# Patient Record
Sex: Female | Born: 1960 | Race: White | Hispanic: No | Marital: Single | Smoking: Current every day smoker
Health system: Southern US, Community
[De-identification: ages and names within clinical notes are randomized; demographics above are authoritative.]

## PROBLEM LIST (undated history)

## (undated) DIAGNOSIS — C801 Malignant (primary) neoplasm, unspecified: Secondary | ICD-10-CM

## (undated) HISTORY — PX: ABDOMINAL HYSTERECTOMY: SHX81

---

## 2012-04-15 ENCOUNTER — Ambulatory Visit: Payer: Self-pay | Admitting: Family Medicine

## 2012-06-19 ENCOUNTER — Emergency Department: Payer: Self-pay | Admitting: Emergency Medicine

## 2012-06-19 LAB — COMPREHENSIVE METABOLIC PANEL
Albumin: 3.4 g/dL (ref 3.4–5.0)
Alkaline Phosphatase: 71 U/L (ref 50–136)
Anion Gap: 3 — ABNORMAL LOW (ref 7–16)
BUN: 9 mg/dL (ref 7–18)
Calcium, Total: 8.2 mg/dL — ABNORMAL LOW (ref 8.5–10.1)
Chloride: 105 mmol/L (ref 98–107)
Creatinine: 0.79 mg/dL (ref 0.60–1.30)
EGFR (African American): >60
Osmolality: 276 (ref 275–301)
SGPT (ALT): 20 U/L (ref 12–78)
Sodium: 139 mmol/L (ref 136–145)
Total Protein: 7.3 g/dL (ref 6.4–8.2)

## 2012-06-19 LAB — URINALYSIS, COMPLETE
Bacteria: NONE SEEN
Bilirubin,UR: NEGATIVE
Glucose,UR: NEGATIVE mg/dL (ref 0–75)
Nitrite: NEGATIVE
Ph: 6 (ref 4.5–8.0)
Protein: NEGATIVE
RBC,UR: 1 /HPF (ref 0–5)

## 2012-06-19 LAB — CBC
HCT: 25.8 % — ABNORMAL LOW (ref 35.0–47.0)
HGB: 8.8 g/dL — ABNORMAL LOW (ref 12.0–16.0)
MCH: 28.7 pg (ref 26.0–34.0)
MCHC: 34 g/dL (ref 32.0–36.0)
RBC: 3.06 10*6/uL — ABNORMAL LOW (ref 3.80–5.20)
RDW: 15.2 % — ABNORMAL HIGH (ref 11.5–14.5)
WBC: 8.5 10*3/uL (ref 3.6–11.0)

## 2012-06-19 LAB — LIPASE, BLOOD: Lipase: 255 U/L (ref 73–393)

## 2014-11-01 ENCOUNTER — Ambulatory Visit: Payer: Self-pay | Admitting: Family Medicine

## 2015-02-15 IMAGING — MG MMM DGT SCR NO ORDER W/CAD
1 series · 4 of 4 positions shown · non-contrast
Comparison: none

REASON FOR EXAM: SCR MAMMO NO ORDER
COMMENTS:

[R CC · right · 4 of 4 slices shown]
[im 1/4]
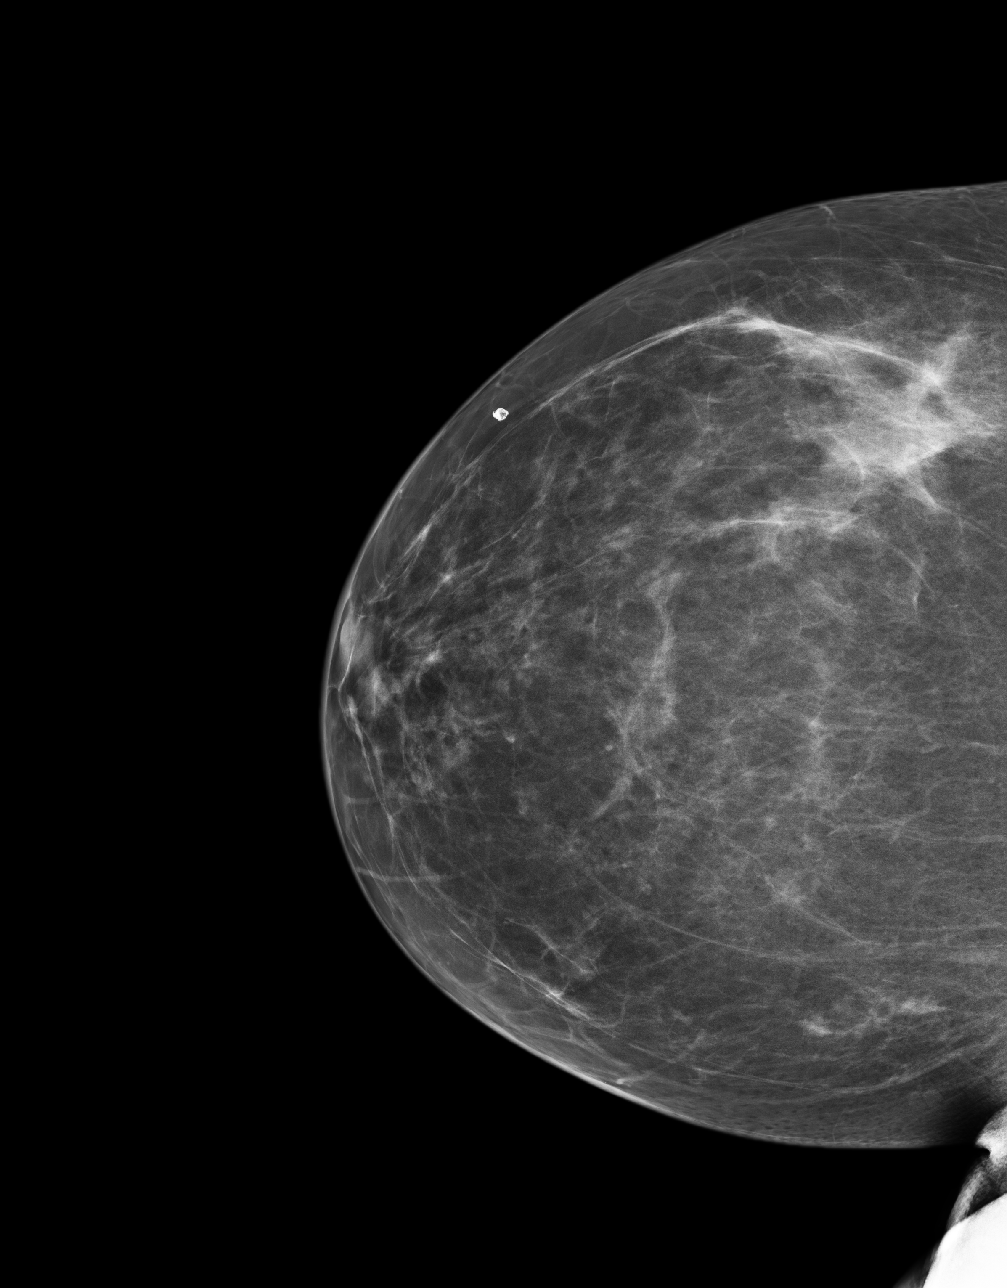
[im 2/4]
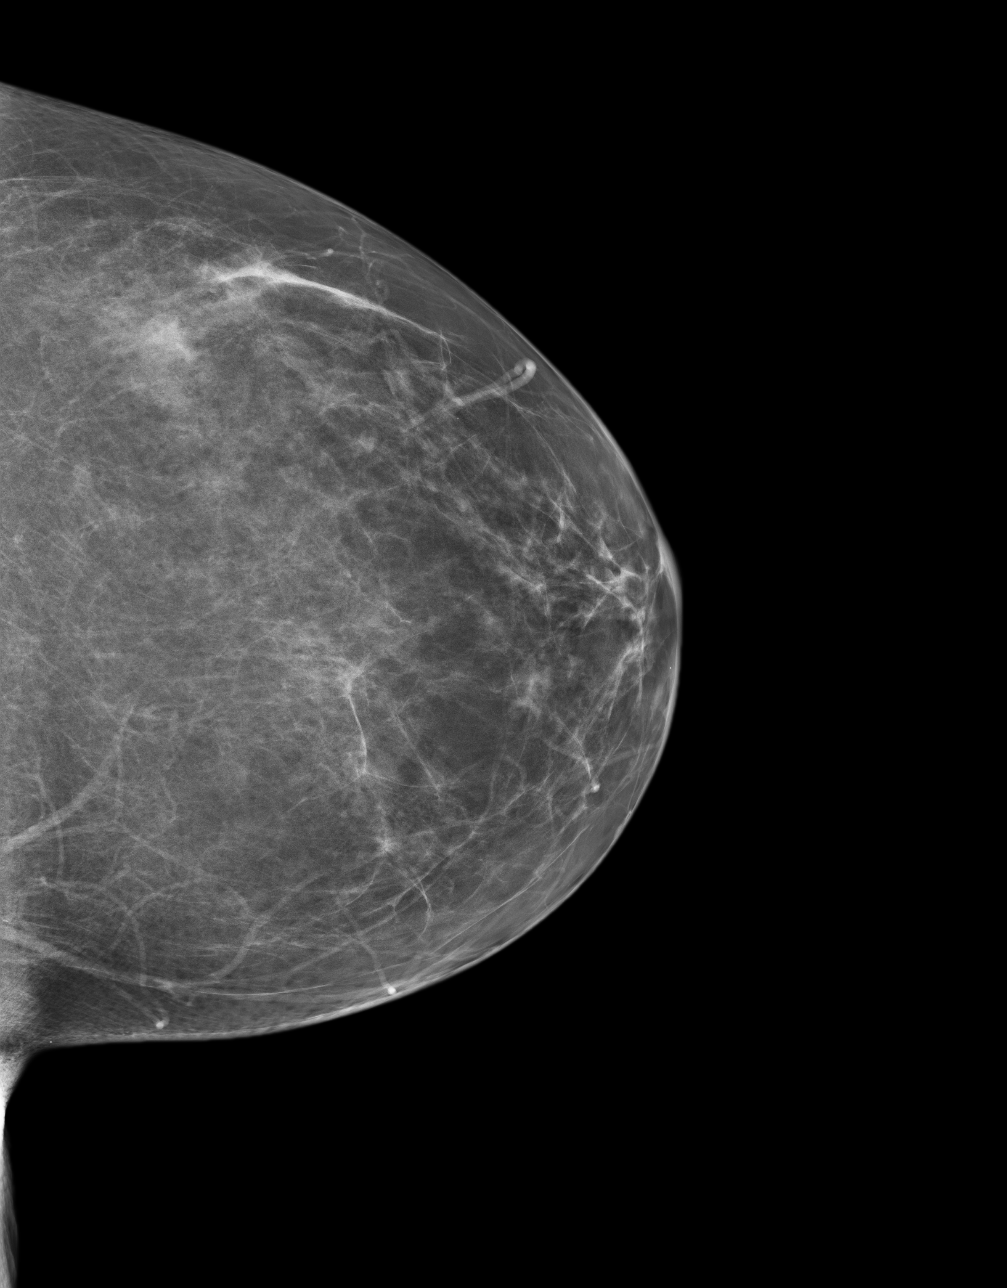
[im 3/4]
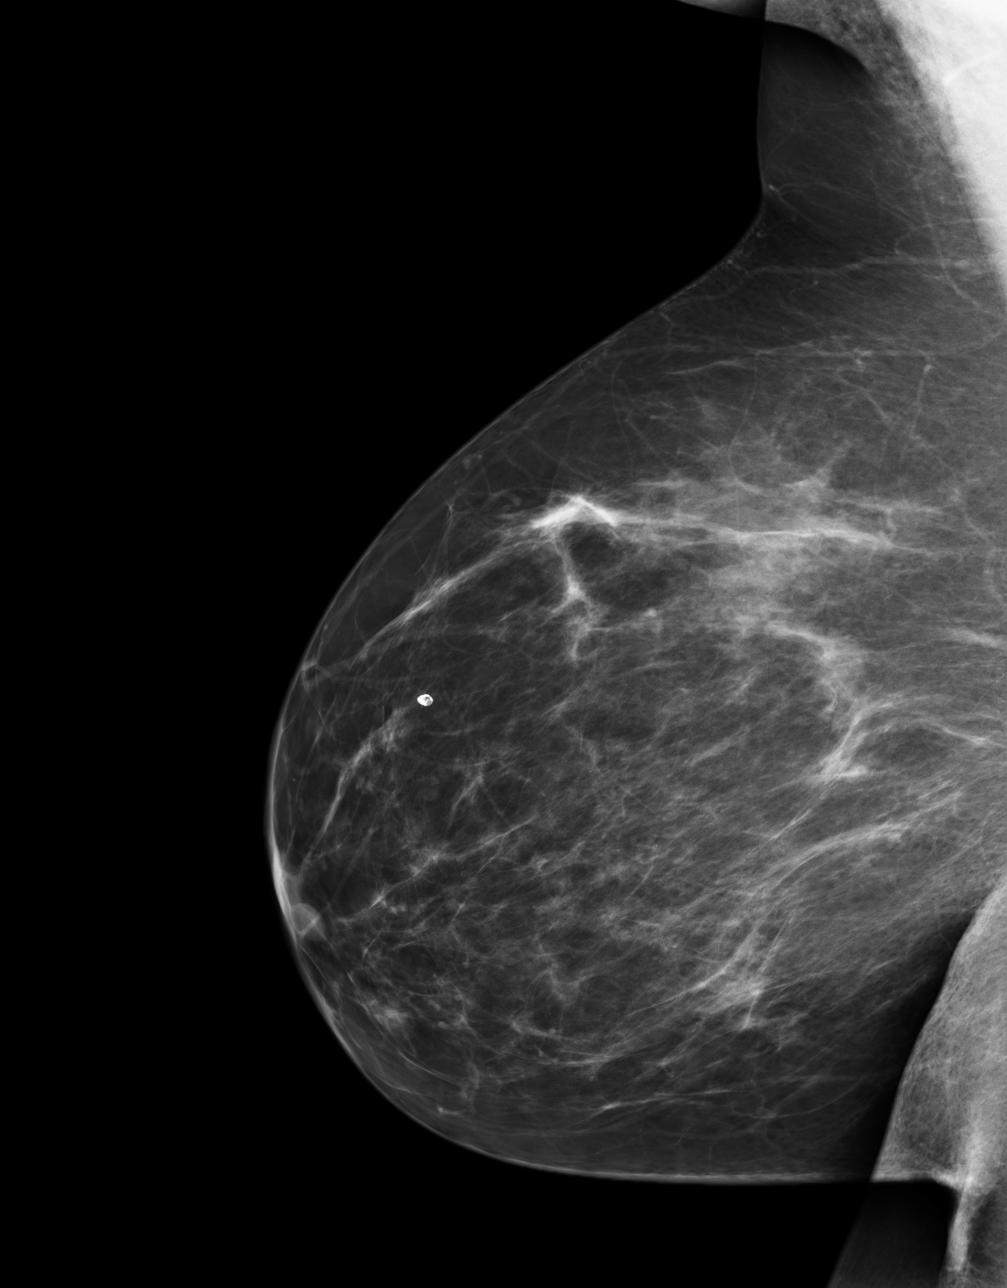
[im 4/4]
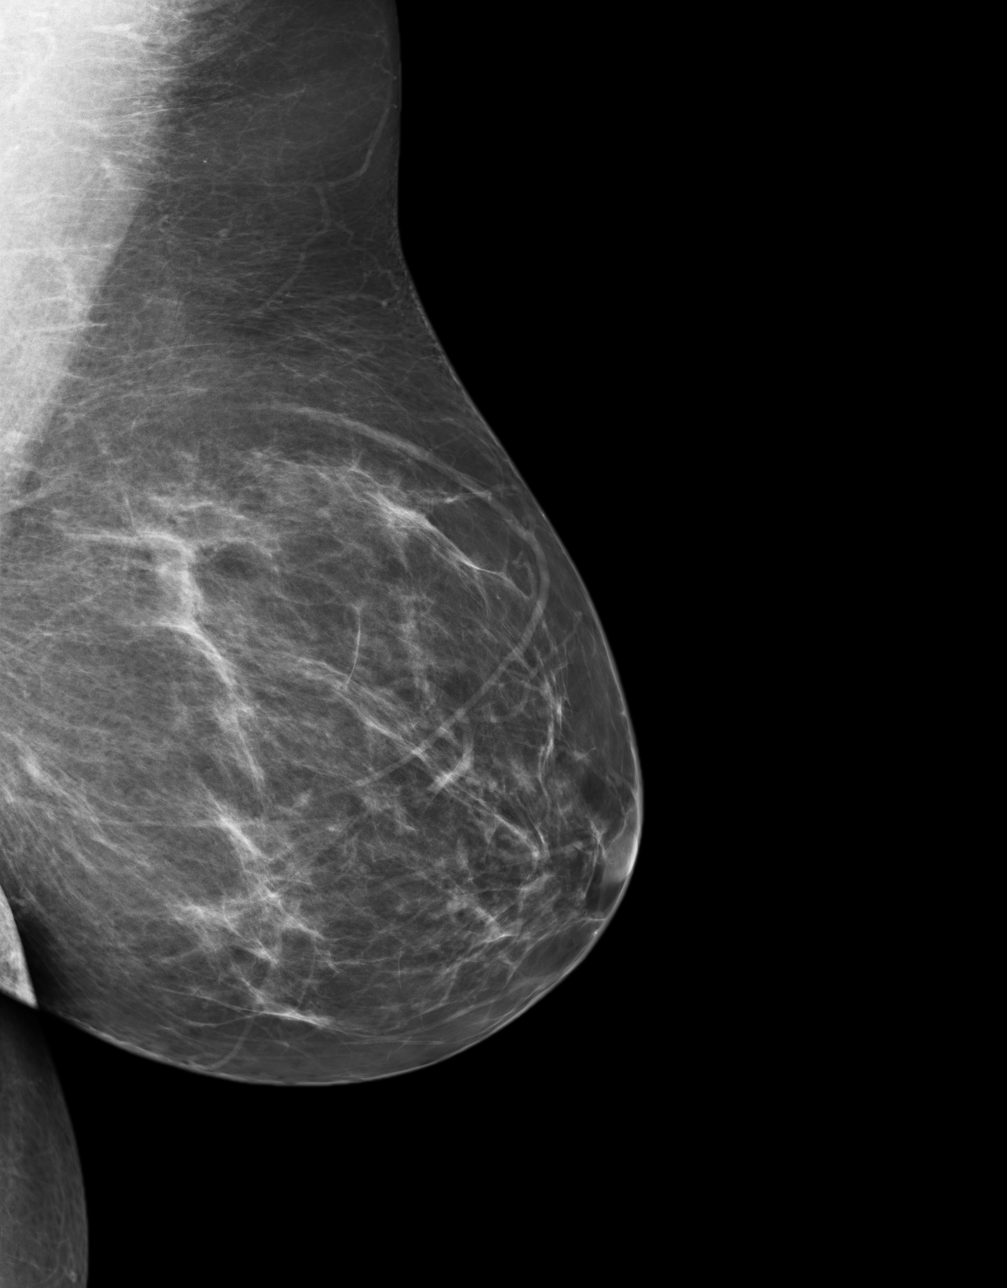

[4 of 4 positions shown; findings below may reference images not displayed]

PROCEDURE:     MMM - MMM DGT SCR NO ORDER W/CAD  - April 15, 2012 [DATE]

RESULT:     This is interpreted as a baseline study. The patient's previous
studies from from an outside facility approximately 12 years ago are not
available for comparison.

The breasts exhibit a scattered fibroglandular pattern. There is no dominant
mass. A benign-appearing macrocalcification is noted in the upper outer
aspect of the right breast. There are no malignant appearing groupings of
microcalcification.
IMPRESSION: There are no findings suspicious for malignancy.

BI-RADS 2: Benign findings.

Recommendation: please began yearly mammographic followup.

BREAST COMPOSITION: The breast composition is SCATTERED FIBROGLANDULAR
TISSUE (glandular tissue is 25-50%)

A NEGATIVE MAMMOGRAM REPORT DOES NOT PRECLUDE BIOPSY OR OTHER EVALUATION OF
A CLINICALLY PALPABLE OR OTHERWISE SUSPICIOUS MASS OR LESION. BREAST CANCER
MAY NOT BE DETECTED BY MAMMOGRAPHY IN UP TO 10% OF CASES.

Dictation site:1

## 2015-06-07 ENCOUNTER — Other Ambulatory Visit: Payer: Self-pay | Admitting: Nurse Practitioner

## 2015-06-07 DIAGNOSIS — Z1231 Encounter for screening mammogram for malignant neoplasm of breast: Secondary | ICD-10-CM

## 2015-06-14 ENCOUNTER — Ambulatory Visit
Admission: RE | Admit: 2015-06-14 | Discharge: 2015-06-14 | Disposition: A | Discharge status: Home / Self Care | Payer: BLUE CROSS/BLUE SHIELD | Source: Ambulatory Visit | Attending: Nurse Practitioner | Admitting: Nurse Practitioner

## 2015-06-14 DIAGNOSIS — Z1231 Encounter for screening mammogram for malignant neoplasm of breast: Secondary | ICD-10-CM | POA: Insufficient documentation

## 2015-06-14 HISTORY — DX: Malignant (primary) neoplasm, unspecified: C80.1

## 2015-06-19 ENCOUNTER — Other Ambulatory Visit: Payer: Self-pay | Admitting: Nurse Practitioner

## 2015-06-19 DIAGNOSIS — R928 Other abnormal and inconclusive findings on diagnostic imaging of breast: Secondary | ICD-10-CM

## 2015-06-27 ENCOUNTER — Other Ambulatory Visit: Payer: Self-pay | Admitting: Nurse Practitioner

## 2015-06-27 ENCOUNTER — Ambulatory Visit
Admission: RE | Admit: 2015-06-27 | Discharge: 2015-06-27 | Disposition: A | Discharge status: Home / Self Care | Payer: BLUE CROSS/BLUE SHIELD | Source: Ambulatory Visit | Attending: Nurse Practitioner | Admitting: Nurse Practitioner

## 2015-06-27 DIAGNOSIS — R921 Mammographic calcification found on diagnostic imaging of breast: Secondary | ICD-10-CM | POA: Diagnosis not present

## 2015-06-27 DIAGNOSIS — R928 Other abnormal and inconclusive findings on diagnostic imaging of breast: Secondary | ICD-10-CM

## 2015-07-13 ENCOUNTER — Ambulatory Visit: Payer: BLUE CROSS/BLUE SHIELD

## 2015-07-13 ENCOUNTER — Ambulatory Visit
Admission: RE | Admit: 2015-07-13 | Discharge: 2015-07-13 | Disposition: A | Discharge status: Home / Self Care | Payer: BLUE CROSS/BLUE SHIELD | Source: Ambulatory Visit | Attending: Nurse Practitioner | Admitting: Nurse Practitioner

## 2015-07-13 DIAGNOSIS — R921 Mammographic calcification found on diagnostic imaging of breast: Secondary | ICD-10-CM

## 2015-07-13 DIAGNOSIS — R920 Mammographic microcalcification found on diagnostic imaging of breast: Secondary | ICD-10-CM | POA: Diagnosis not present

## 2015-07-13 HISTORY — PX: BREAST BIOPSY: SHX20

## 2015-07-17 LAB — SURGICAL PATHOLOGY

## 2016-03-15 ENCOUNTER — Other Ambulatory Visit: Payer: Self-pay | Admitting: Physician Assistant

## 2016-03-15 DIAGNOSIS — M25512 Pain in left shoulder: Secondary | ICD-10-CM

## 2018-04-15 IMAGING — MG MM DIGITAL SCREENING BILATERAL
3 series · 7 of 7 positions shown · non-contrast
Comparison: Previous exam(s).

CLINICAL DATA: Screening.

EXAM:
DIGITAL SCREENING BILATERAL MAMMOGRAM WITH CAD

[R CC · right · 5 of 5 slices shown (1 of 2)]
[im 1/5]
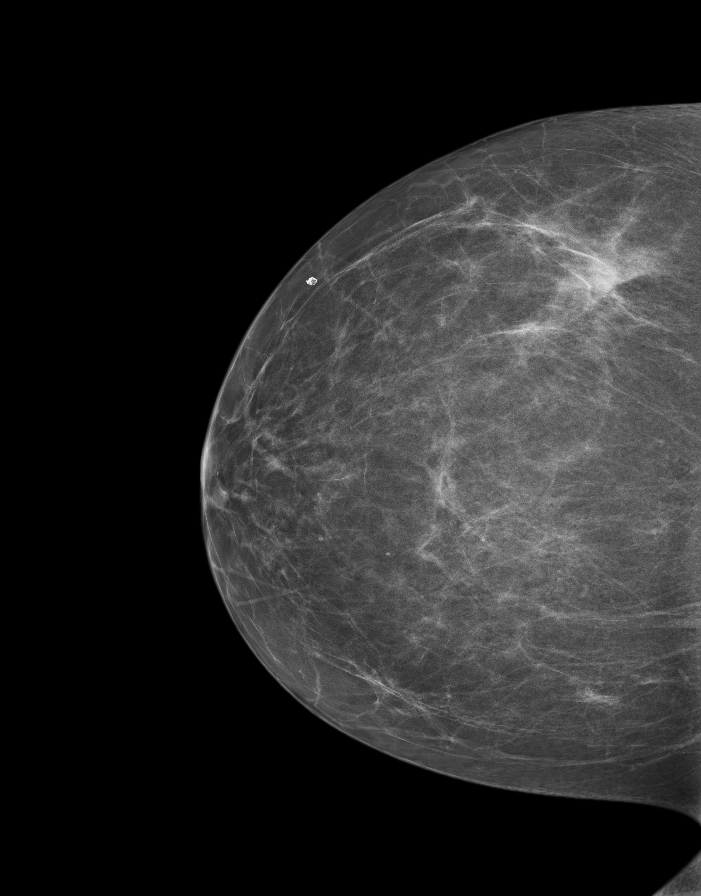
[im 2/5]
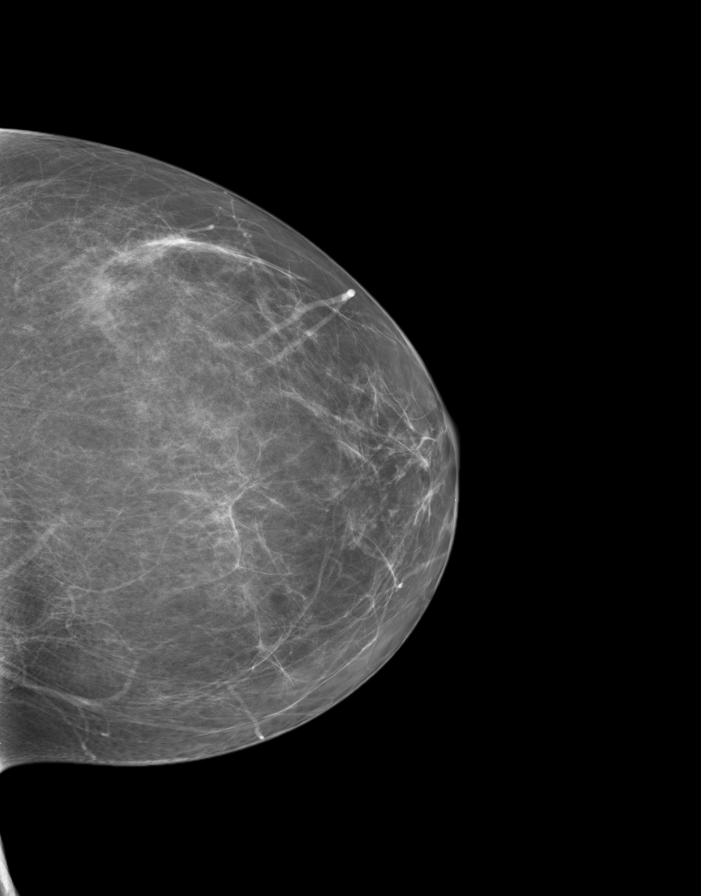
[im 3/5]
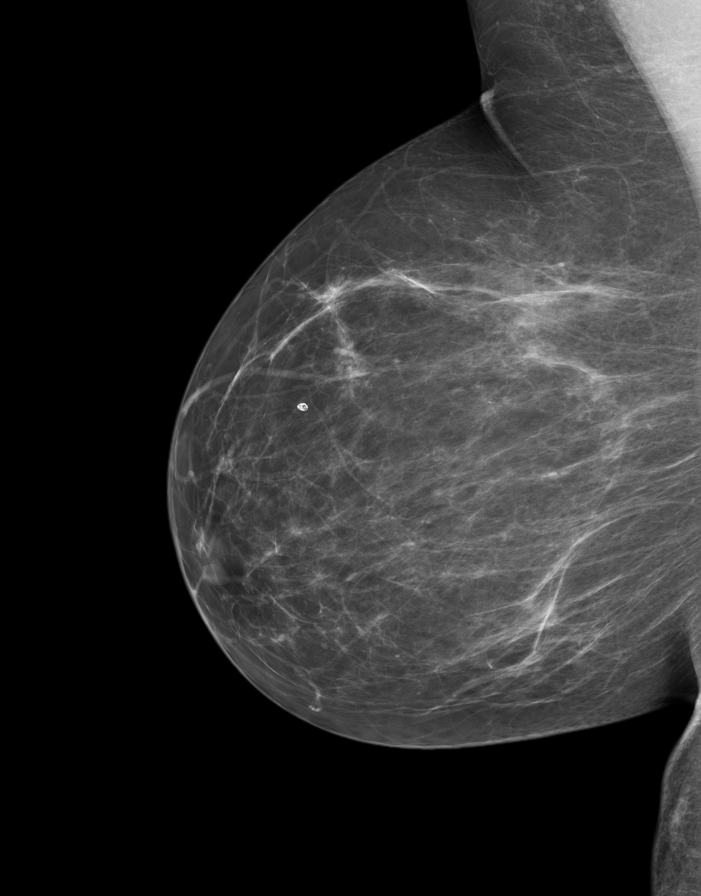
[im 4/5]
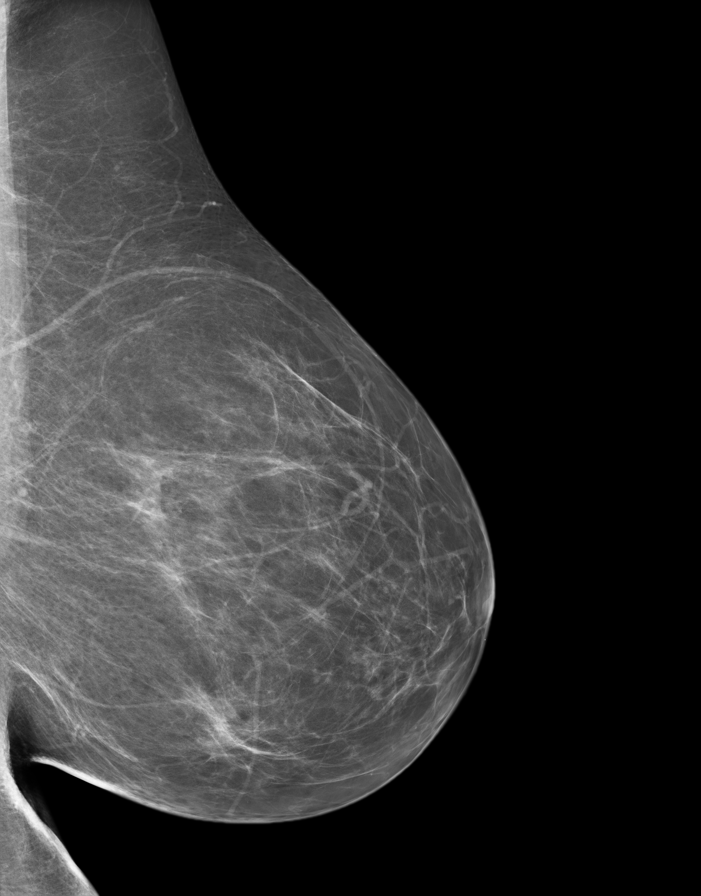
[im 5/5]
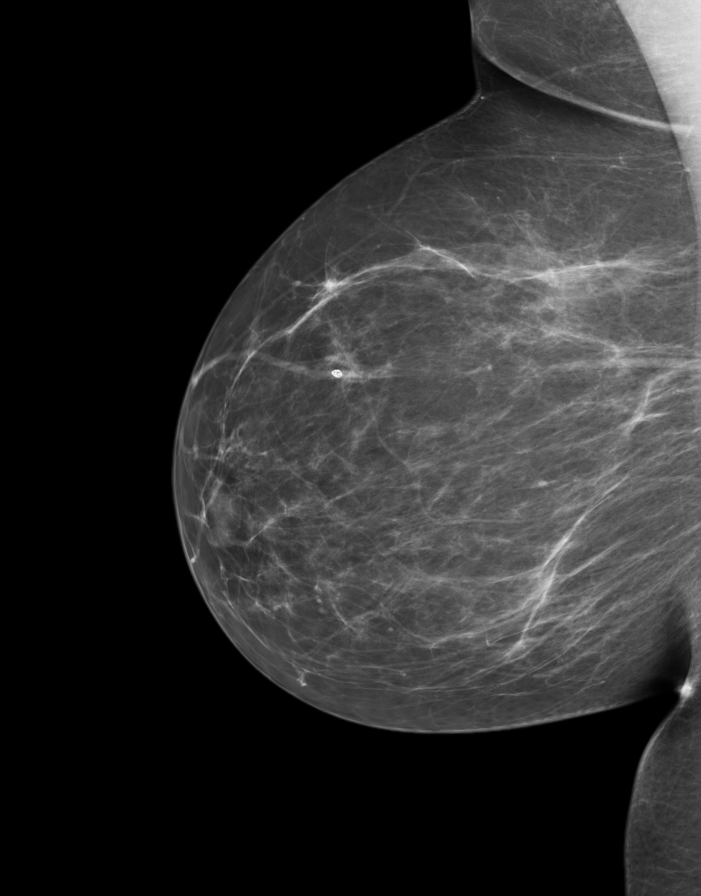

[R CC (2 of 2)]
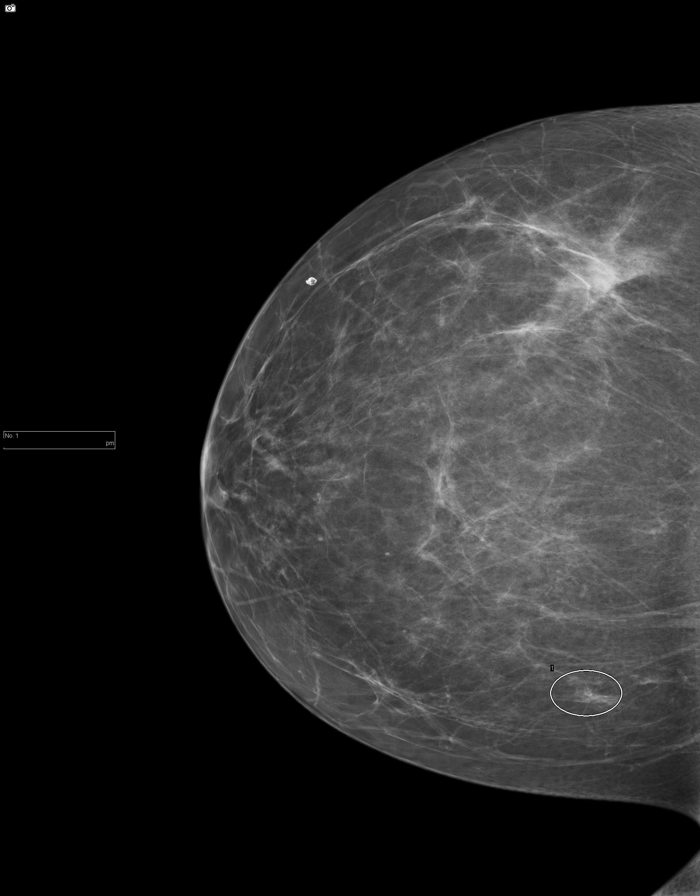

[R MLO]
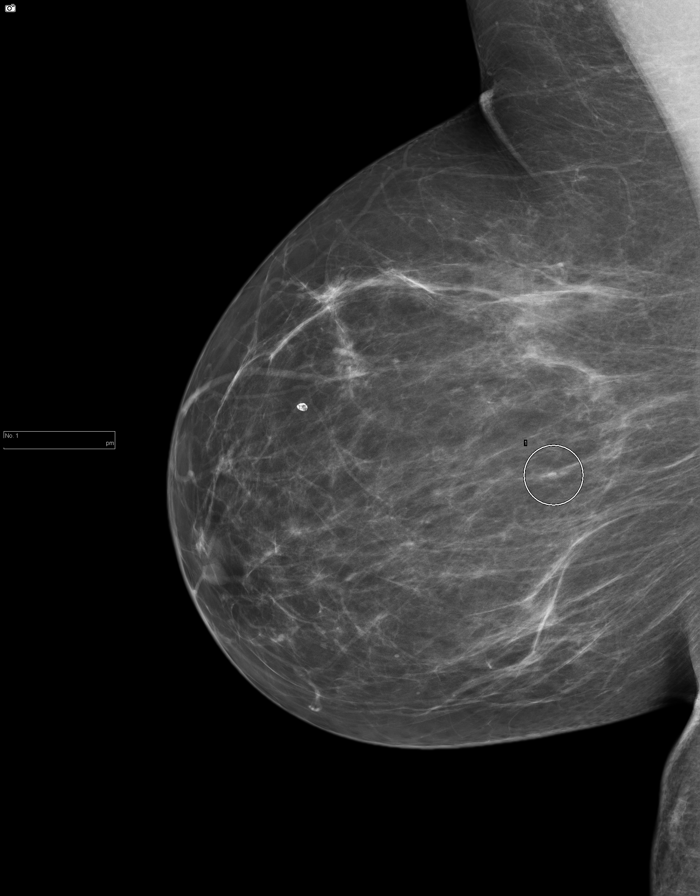

[7 of 7 positions shown; findings below may reference images not displayed]

ACR Breast Density Category b: There are scattered areas of
fibroglandular density.
FINDINGS: In the right breast, calcifications warrant further evaluation with
magnified views. In the left breast, no findings suspicious for
malignancy. Images were processed with CAD.
IMPRESSION: Further evaluation is suggested for calcifications in the right
breast.

RECOMMENDATION:
Diagnostic mammogram of the right breast. (Code:[id number])

The patient will be contacted regarding the findings, and additional
imaging will be scheduled.

BI-RADS CATEGORY  0: Incomplete. Need additional imaging evaluation
and/or prior mammograms for comparison.

## 2021-10-01 ENCOUNTER — Ambulatory Visit
Admission: EM | Admit: 2021-10-01 | Discharge: 2021-10-01 | Disposition: A | Discharge status: Home / Self Care | Payer: BLUE CROSS/BLUE SHIELD

## 2021-10-01 ENCOUNTER — Encounter: Payer: Self-pay | Admitting: Emergency Medicine

## 2021-10-01 DIAGNOSIS — R0602 Shortness of breath: Secondary | ICD-10-CM | POA: Diagnosis not present

## 2021-10-01 DIAGNOSIS — R051 Acute cough: Secondary | ICD-10-CM

## 2021-10-01 DIAGNOSIS — J209 Acute bronchitis, unspecified: Secondary | ICD-10-CM | POA: Diagnosis not present

## 2021-10-01 MED ORDER — PREDNISONE 20 MG PO TABS: 40.0000 mg | ORAL_TABLET | Freq: Every day | ORAL | 0 refills | Status: AC

## 2021-10-01 MED ORDER — GUAIFENESIN-CODEINE 100-10 MG/5ML PO SYRP: 10.0000 mL | ORAL_SOLUTION | Freq: Three times a day (TID) | ORAL | 0 refills | Status: DC | PRN

## 2021-10-01 MED ORDER — ALBUTEROL SULFATE HFA 108 (90 BASE) MCG/ACT IN AERS: 1.0000 | INHALATION_SPRAY | Freq: Four times a day (QID) | RESPIRATORY_TRACT | 0 refills | Status: AC | PRN

## 2021-10-01 NOTE — ED Triage Notes (Signed)
Pt c/o cough, nasal congestion. Started about 5 days ago. She states it has been getting worse. She states she can not sleep at night. She has tried multiple otc cough medications and remedies without relief.

## 2021-10-01 NOTE — ED Provider Notes (Signed)
[facility]    CSN: [id number] Arrival date & time: 10/01/21  0935      History   Chief Complaint Chief Complaint  Patient presents with   Cough    HPI Hannah Rowland is a 61 y.o. female presenting for 5-day history of cough and nasal congestion.  Patient reports pain in her chest when coughing.  Also reports some shortness of breath at times.  Patient has had some sinus pressure as well.  Patient denies fever, fatigue, sore throat, ear pain, vomiting or diarrhea.  She says she has been taking over-the-counter cough medications without any relief of symptoms.  Reports that she has difficulty sleeping at night due to all the coughing.  Reports her husband had similar symptoms a couple weeks ago and history of the Z-Pak. . Medical history significant for hypertension, hyperlipidemia, cervical cancer.  HPI  Past Medical History:  Diagnosis Date   Cancer ([place])    cervical    There are no problems to display for this patient.   Past Surgical History:  Procedure Laterality Date   ABDOMINAL HYSTERECTOMY     BREAST BIOPSY Right 07/13/2015   path pending    OB History   No obstetric history on file.      Home Medications    Prior to Admission medications   Medication Sig Start Date End Date Taking? Authorizing Provider  Semaglutide (OZEMPIC, 0.25 OR 0.5 MG/DOSE, ) Inject into the skin.   Yes [provider]  albuterol (VENTOLIN HFA) 108 (90 Base) MCG/ACT inhaler Inhale 1-2 puffs into the lungs every 6 (six) hours as needed for wheezing or shortness of breath. 10/01/21  Yes Danton Clap, PA-C  guaiFENesin-codeine (ROBITUSSIN AC) 100-10 MG/5ML syrup Take 10 mLs by mouth 3 (three) times daily as needed for cough. 10/01/21  Yes Danton Clap, PA-C  predniSONE (DELTASONE) 20 MG tablet Take 2 tablets (40 mg total) by mouth daily for 5 days. 10/01/21 10/06/21 Yes Danton Clap, PA-C    Family History Family History  Problem Relation Age of Onset    Breast cancer Neg Hx     Social History Social History   Tobacco Use   Smoking status: Every Day    Packs/day: 0.50    Types: Cigarettes   Smokeless tobacco: Never  Vaping Use   Vaping Use: Never used  Substance Use Topics   Alcohol use: Never   Drug use: Never     Allergies   Patient has no known allergies.   Review of Systems Review of Systems  Constitutional:  Negative for chills, diaphoresis, fatigue and fever.  HENT:  Positive for congestion, rhinorrhea and sinus pressure. Negative for ear pain, sinus pain and sore throat.   Respiratory:  Positive for cough. Negative for shortness of breath.   Cardiovascular:  Positive for chest pain (with cough).  Gastrointestinal:  Negative for abdominal pain, nausea and vomiting.  Musculoskeletal:  Negative for arthralgias and myalgias.  Skin:  Negative for rash.  Neurological:  Negative for weakness and headaches.  Hematological:  Negative for adenopathy.  Psychiatric/Behavioral:  Positive for sleep disturbance.      Physical Exam Triage Vital Signs ED Triage Vitals  Enc Vitals Group     BP 10/01/21 0951 137/88     Pulse Rate 10/01/21 0951 61     Resp 10/01/21 0951 16     Temp 10/01/21 0951 98.2 F (36.8 C)     Temp Source 10/01/21 0951 Oral  SpO2 10/01/21 0951 95 %     Weight 10/01/21 0948 203 lb (92.1 kg)     Height 10/01/21 0948 '5\' 1"'$  (1.549 m)     Head Circumference --      Peak Flow --      Pain Score 10/01/21 0947 1     Pain Loc --      Pain Edu? --      Excl. in [place]? --    No data found.  Updated Vital Signs BP 137/88 (BP Location: Left Arm)   Pulse 61   Temp 98.2 F (36.8 C) (Oral)   Resp 16   Ht '5\' 1"'$  (1.549 m)   Wt 203 lb (92.1 kg)   SpO2 95%   BMI 38.36 kg/m     Physical Exam Vitals and nursing note reviewed.  Constitutional:      General: She is not in acute distress.    Appearance: Normal appearance. She is ill-appearing. She is not toxic-appearing.  HENT:     Head: Normocephalic  and atraumatic.     Nose: Congestion present.     Mouth/Throat:     Mouth: Mucous membranes are moist.     Pharynx: Oropharynx is clear. Posterior oropharyngeal erythema (mild) present.  Eyes:     General: No scleral icterus.       Right eye: No discharge.        Left eye: No discharge.     Conjunctiva/sclera: Conjunctivae normal.  Cardiovascular:     Rate and Rhythm: Normal rate and regular rhythm.     Heart sounds: Normal heart sounds.  Pulmonary:     Effort: Pulmonary effort is normal. No respiratory distress.     Breath sounds: Normal breath sounds. No wheezing, rhonchi or rales.  Musculoskeletal:     Cervical back: Neck supple.  Skin:    General: Skin is dry.  Neurological:     General: No focal deficit present.     Mental Status: She is alert. Mental status is at baseline.     Motor: No weakness.     Gait: Gait normal.  Psychiatric:        Mood and Affect: Mood normal.        Behavior: Behavior normal.        Thought Content: Thought content normal.      UC Treatments / Results  Labs (all labs ordered are listed, but only abnormal results are displayed) Labs Reviewed - No data to display  EKG   Radiology No results found.  Procedures Procedures (including critical care time)  Medications Ordered in UC Medications - No data to display  Initial Impression / Assessment and Plan / UC Course  I have reviewed the triage vital signs and the nursing notes.  Pertinent labs & imaging results that were available during my care of the patient were reviewed by me and considered in my medical decision making (see chart for details).   61 year old female presenting for cough and congestion x5 days.  Vitals all normal and stable.  Patient is mildly ill-appearing but nontoxic.  On exam, patient does have nasal congestion.  Throat is mildly erythematous.  Chest clear to auscultation and heart regular rate and rhythm.  Patient does cough numerous times throughout  evaluation.  Cough sounds very deep.  Suspect viral illness.  Symptoms may be consistent with viral bronchitis.  We will treat at this time with Cheratussin, prednisone, and ProAir.  Supportive care encouraged.  Advised increasing rest and fluids.  Reviewed returning if fever, worsening cough, increased breathing difficulty, sinus pain or not feeling better after 10 days.   Final Clinical Impressions(s) / UC Diagnoses   Final diagnoses:  Acute bronchitis, unspecified organism  Acute cough  Shortness of breath     Discharge Instructions      URI/COLD SYMPTOMS: Your exam today is consistent with a viral illness.  You have a chest cold. Antibiotics are not indicated at this time. Use medications as directed, including cough syrup, nasal saline, and decongestants. Your symptoms should improve over the next few days and resolve within another 7-10 days. Increase rest and fluids. F/u if symptoms worsen or predominate such as sore throat, ear pain, productive cough, shortness of breath, or if you develop high fevers or worsening fatigue over the next several days.       ED Prescriptions     Medication Sig Dispense Auth. Provider   albuterol (VENTOLIN HFA) 108 (90 Base) MCG/ACT inhaler Inhale 1-2 puffs into the lungs every 6 (six) hours as needed for wheezing or shortness of breath. 1 g Laurene Footman B, PA-C   guaiFENesin-codeine (ROBITUSSIN AC) 100-10 MG/5ML syrup Take 10 mLs by mouth 3 (three) times daily as needed for cough. 120 mL Laurene Footman B, PA-C   predniSONE (DELTASONE) 20 MG tablet Take 2 tablets (40 mg total) by mouth daily for 5 days. 10 tablet Danton Clap, PA-C      I have reviewed the PDMP during this encounter.   Danton Clap, PA-C 10/01/21 1022

## 2021-10-01 NOTE — Discharge Instructions (Addendum)
URI/COLD SYMPTOMS: Your exam today is consistent with a viral illness.  You have a chest cold. Antibiotics are not indicated at this time. Use medications as directed, including cough syrup, nasal saline, and decongestants. Your symptoms should improve over the next few days and resolve within another 7-10 days. Increase rest and fluids. F/u if symptoms worsen or predominate such as sore throat, ear pain, productive cough, shortness of breath, or if you develop high fevers or worsening fatigue over the next several days.

## 2021-10-15 ENCOUNTER — Encounter: Payer: Self-pay | Admitting: Emergency Medicine

## 2021-10-15 ENCOUNTER — Ambulatory Visit
Admission: EM | Admit: 2021-10-15 | Discharge: 2021-10-15 | Disposition: A | Discharge status: Home / Self Care | Payer: BLUE CROSS/BLUE SHIELD | Attending: Physician Assistant | Admitting: Physician Assistant

## 2021-10-15 ENCOUNTER — Ambulatory Visit (INDEPENDENT_AMBULATORY_CARE_PROVIDER_SITE_OTHER): Payer: BLUE CROSS/BLUE SHIELD

## 2021-10-15 DIAGNOSIS — R059 Cough, unspecified: Secondary | ICD-10-CM

## 2021-10-15 DIAGNOSIS — Z1152 Encounter for screening for COVID-19: Secondary | ICD-10-CM | POA: Diagnosis not present

## 2021-10-15 DIAGNOSIS — J40 Bronchitis, not specified as acute or chronic: Secondary | ICD-10-CM | POA: Insufficient documentation

## 2021-10-15 DIAGNOSIS — Z79899 Other long term (current) drug therapy: Secondary | ICD-10-CM | POA: Insufficient documentation

## 2021-10-15 LAB — RESP PANEL BY RT-PCR (FLU A&B, COVID) ARPGX2
Influenza A by PCR: NEGATIVE
Influenza B by PCR: NEGATIVE
SARS Coronavirus 2 by RT PCR: NEGATIVE

## 2021-10-15 MED ORDER — AZITHROMYCIN 250 MG PO TABS: ORAL_TABLET | ORAL | 0 refills | Status: DC

## 2021-10-15 MED ORDER — HYDROCODONE BIT-HOMATROP MBR 5-1.5 MG/5ML PO SOLN: 5.0000 mL | Freq: Four times a day (QID) | ORAL | 0 refills | Status: DC | PRN

## 2021-10-15 NOTE — ED Provider Notes (Signed)
[facility]    CSN: [id number] Arrival date & time: 10/15/21  0932      History   Chief Complaint Chief Complaint  Patient presents with   Cough    HPI Hannah Rowland is a 61 y.o. female who presents with unresolved cough from having viral bronchitis at the end of September. Was feeling better, but got worse 4 days ago when she started tapering off the prednisone. She has been coughing for at least 3 weeks. Her cough is non productive. Has been using her inhaler. Has been having sweats, fatigue and decreased appetite.    Past Medical History:  Diagnosis Date   Cancer ([place])    cervical    There are no problems to display for this patient.   Past Surgical History:  Procedure Laterality Date   ABDOMINAL HYSTERECTOMY     BREAST BIOPSY Right 07/13/2015   path pending    OB History   No obstetric history on file.      Home Medications    Prior to Admission medications   Medication Sig Start Date End Date Taking? Authorizing Provider  albuterol (VENTOLIN HFA) 108 (90 Base) MCG/ACT inhaler Inhale 1-2 puffs into the lungs every 6 (six) hours as needed for wheezing or shortness of breath. 10/01/21  Yes Laurene Footman B, PA-C  azithromycin (ZITHROMAX Z-PAK) 250 MG tablet 2 today, then one qd x 4 days 10/15/21  Yes Rodriguez-Southworth, Sunday Spillers, PA-C  HYDROcodone bit-homatropine (HYCODAN) 5-1.5 MG/5ML syrup Take 5 mLs by mouth every 6 (six) hours as needed for cough. 10/15/21  Yes Rodriguez-Southworth, Sunday Spillers, PA-C  Semaglutide (OZEMPIC, 0.25 OR 0.5 MG/DOSE, [place]) Inject into the skin.   Yes [provider]    Family History Family History  Problem Relation Age of Onset   Breast cancer Neg Hx     Social History Social History   Tobacco Use   Smoking status: Every Day    Packs/day: 0.50    Types: Cigarettes   Smokeless tobacco: Never  Vaping Use   Vaping Use: Never used  Substance Use Topics   Alcohol use: Never   Drug use: Never     Allergies    Patient has no known allergies.   Review of Systems Review of Systems  Constitutional:  Positive for appetite change, diaphoresis and fatigue. Negative for chills and fever.  HENT:  Positive for congestion and rhinorrhea. Negative for ear discharge and ear pain.   Respiratory:  Positive for cough and wheezing. Negative for shortness of breath.   Musculoskeletal:  Negative for myalgias.  Neurological:  Negative for headaches.     Physical Exam Triage Vital Signs ED Triage Vitals  Enc Vitals Group     BP 10/15/21 1021 (!) 113/92     Pulse Rate 10/15/21 1021 67     Resp 10/15/21 1021 18     Temp 10/15/21 1021 98.2 F (36.8 C)     Temp Source 10/15/21 1021 Oral     SpO2 10/15/21 1021 99 %     Weight 10/15/21 1018 203 lb 0.7 oz (92.1 kg)     Height 10/15/21 1018 '5\' 1"'$  (1.549 m)     Head Circumference --      Peak Flow --      Pain Score 10/15/21 1017 6     Pain Loc --      Pain Edu? --      Excl. in [place]? --    No data found.  Updated Vital Signs  BP (!) 113/92 (BP Location: Right Arm)   Pulse 67   Temp 98.2 F (36.8 C) (Oral)   Resp 18   Ht '5\' 1"'$  (1.549 m)   Wt 203 lb 0.7 oz (92.1 kg)   SpO2 99%   BMI 38.36 kg/m   Visual Acuity Right Eye Distance:   Left Eye Distance:   Bilateral Distance:    Right Eye Near:   Left Eye Near:    Bilateral Near:      Physical Exam Constitutional:      General: He is not in acute distress.    Appearance: He is not toxic-appearing.  HENT:     Head: Normocephalic.     Right Ear: Tympanic membrane, ear canal and external ear normal.     Left Ear: Ear canal and external ear normal.     Nose: Nose normal.     Mouth/Throat:     Mouth: Mucous membranes are moist.     Pharynx: Oropharynx is clear.  Eyes:     General: No scleral icterus.    Conjunctiva/sclera: Conjunctivae normal.  Cardiovascular:     Rate and Rhythm: Normal rate and regular rhythm.     Heart sounds: No murmur heard.   Pulmonary:     Effort: Pulmonary  effort is normal. No respiratory distress.     Breath sounds: Wheezing present at the end of exhalation. Her cough sounds wheezy    Musculoskeletal:        General: Normal range of motion.     Cervical back: Neck supple.  Lymphadenopathy:     Cervical: No cervical adenopathy.  Skin:    General: Skin is warm and dry.     Findings: No rash.  Neurological:     Mental Status: He is alert and oriented to person, place, and time.     Gait: Gait normal.  Psychiatric:        Mood and Affect: Mood normal.        Behavior: Behavior normal.        Thought Content: Thought content normal.        Judgment: Judgment normal.    UC Treatments / Results  Labs (all labs ordered are listed, but only abnormal results are displayed) Labs Reviewed  RESP PANEL BY RT-PCR (FLU A&B, COVID) [id number]  negative  EKG   Radiology DG Chest 2 View  Result Date: 10/15/2021 CLINICAL DATA:  3-4 weeks of cough EXAM: CHEST - 2 VIEW COMPARISON:  None Available. FINDINGS: The heart size and mediastinal contours are within normal limits. Both lungs are clear. The visualized skeletal structures are unremarkable. IMPRESSION: No active cardiopulmonary disease. Electronically Signed   By: Placido Sou M.D.   On: 10/15/2021 11:23    Procedures Procedures (including critical care time)  Medications Ordered in UC Medications - No data to display  Initial Impression / Assessment and Plan / UC Course  I have reviewed the triage vital signs and the nursing notes.  Pertinent labs & imaging results that were available during my care of the patient were reviewed by me and considered in my medical decision making (see chart for details).   Bronchitis Bronchospasms  I placed her on Zpack and Hycodan as noted. See instructions.  Final Clinical Impressions(s) / UC Diagnoses  Bronchitis Bronchospasms Final diagnoses:  Bronchitis     Discharge Instructions      Continue using your inhaler every 4  hours     ED Prescriptions     Medication  Sig Dispense Auth. Provider   HYDROcodone bit-homatropine (HYCODAN) 5-1.5 MG/5ML syrup Take 5 mLs by mouth every 6 (six) hours as needed for cough. 120 mL Rodriguez-Southworth, Marcella Charlson, PA-C   azithromycin (ZITHROMAX Z-PAK) 250 MG tablet 2 today, then one qd x 4 days 6 tablet Rodriguez-Southworth, Sunday Spillers, PA-C      I have reviewed the PDMP during this encounter.   Shelby Mattocks, PA-C 10/15/21 1151

## 2021-10-15 NOTE — Discharge Instructions (Signed)
Continue using your inhaler every 4 hours

## 2021-10-15 NOTE — ED Triage Notes (Signed)
Pt c/o "bronchitis". She c/o cough, runny nose, vomiting, decreased appetite, chest tightness, diarrhea. Started about a week ago. She recently had bronchitis at the end of October and states she was feeling better but then go worse again. Denies fever.

## 2021-11-27 ENCOUNTER — Ambulatory Visit
Admission: EM | Admit: 2021-11-27 | Discharge: 2021-11-27 | Disposition: A | Discharge status: Home / Self Care | Payer: BLUE CROSS/BLUE SHIELD | Attending: Family Medicine | Admitting: Family Medicine

## 2021-11-27 DIAGNOSIS — Z1152 Encounter for screening for COVID-19: Secondary | ICD-10-CM | POA: Diagnosis not present

## 2021-11-27 DIAGNOSIS — J029 Acute pharyngitis, unspecified: Secondary | ICD-10-CM | POA: Insufficient documentation

## 2021-11-27 LAB — RESP PANEL BY RT-PCR (RSV, FLU A&B, COVID)  RVPGX2
Influenza A by PCR: NEGATIVE
Influenza B by PCR: NEGATIVE
Resp Syncytial Virus by PCR: NEGATIVE
SARS Coronavirus 2 by RT PCR: NEGATIVE

## 2021-11-27 LAB — GROUP A STREP BY PCR: Group A Strep by PCR: NOT DETECTED

## 2021-11-27 MED ORDER — LIDOCAINE VISCOUS HCL 2 % MT SOLN: 15.0000 mL | OROMUCOSAL | 0 refills | Status: AC | PRN

## 2021-11-27 MED ORDER — HYDROCODONE BIT-HOMATROP MBR 5-1.5 MG/5ML PO SOLN: 5.0000 mL | Freq: Four times a day (QID) | ORAL | 0 refills | Status: DC | PRN

## 2021-11-27 MED ORDER — HYDROCODONE BIT-HOMATROP MBR 5-1.5 MG/5ML PO SOLN: 5.0000 mL | Freq: Four times a day (QID) | ORAL | 0 refills | Status: AC | PRN

## 2021-11-27 MED ORDER — LIDOCAINE VISCOUS HCL 2 % MT SOLN
15.0000 mL | Freq: Once | OROMUCOSAL | Status: AC
  Administered 2021-11-27: 15 mL via ORAL

## 2021-11-27 NOTE — ED Triage Notes (Signed)
Pt states that she fell out of her office chair and then the next day she blacked out and fell. Pt declines assessment for fall and states that she is fine.

## 2021-11-27 NOTE — ED Provider Notes (Signed)
[facility]    CSN: [id number] Arrival date & time: 11/27/21  0810      History   Chief Complaint Chief Complaint  Patient presents with   Cough   Sore Throat    HPI Hannah Rowland is a 61 y.o. female.   HPI   Hannah Rowland presents for sore throat and cough for the past 3 days. Has dry mouth that is worse at night. Feels like she is cannot catch her breath.  She has painful swallowing and her throat bothers her more than anything.  Of note, she was treated for bronchitis 3 weeks ago with steroids and antibiotics and she completed this treatment.  She states that her symptoms resolved but then they came back.  Denies fever, chills, rhinorrhea, vomiting, diarrhea.  Her sore throat keeps her up at night.  She uses tobacco.       Past Medical History:  Diagnosis Date   Cancer ([place])    cervical    There are no problems to display for this patient.   Past Surgical History:  Procedure Laterality Date   ABDOMINAL HYSTERECTOMY     BREAST BIOPSY Right 07/13/2015   path pending    OB History   No obstetric history on file.      Home Medications    Prior to Admission medications   Medication Sig Start Date End Date Taking? Authorizing Provider  albuterol (VENTOLIN HFA) 108 (90 Base) MCG/ACT inhaler Inhale 1-2 puffs into the lungs every 6 (six) hours as needed for wheezing or shortness of breath. 10/01/21  Yes Laurene Footman B, PA-C  gabapentin (NEURONTIN) 300 MG capsule Take by mouth. 11/13/21 11/13/22 Yes [provider]  hydrochlorothiazide (HYDRODIURIL) 25 MG tablet Take by mouth. 11/13/21 11/13/22 Yes [provider]  lidocaine (XYLOCAINE) 2 % solution Use as directed 15 mLs in the mouth or throat as needed for mouth pain. 11/27/21  Yes Natsha Guidry, DO  Semaglutide (OZEMPIC, 0.25 OR 0.5 MG/DOSE, [place]) Inject into the skin.   Yes [provider]  HYDROcodone bit-homatropine (HYCODAN) 5-1.5 MG/5ML syrup Take 5 mLs by mouth every 6 (six)  hours as needed for cough. 11/27/21   Lyndee Hensen, DO    Family History Family History  Problem Relation Age of Onset   Breast cancer Neg Hx     Social History Social History   Tobacco Use   Smoking status: Every Day    Packs/day: 0.50    Types: Cigarettes   Smokeless tobacco: Never  Vaping Use   Vaping Use: Never used  Substance Use Topics   Alcohol use: Never   Drug use: Never     Allergies   Patient has no known allergies.   Review of Systems Review of Systems: negative unless otherwise stated in HPI.      Physical Exam Triage Vital Signs ED Triage Vitals  Enc Vitals Group     BP 11/27/21 0826 (!) 138/102     Pulse Rate 11/27/21 0826 95     Resp 11/27/21 0826 18     Temp 11/27/21 0826 98.1 F (36.7 C)     Temp Source 11/27/21 0826 Oral     SpO2 11/27/21 0826 97 %     Weight 11/27/21 0823 193 lb (87.5 kg)     Height 11/27/21 0823 '5\' 1"'$  (1.549 m)     Head Circumference --      Peak Flow --      Pain Score 11/27/21 0823 6  Pain Loc --      Pain Edu? --      Excl. in [place]? --    No data found.  Updated Vital Signs BP (!) 138/102 (BP Location: Left Arm)   Pulse 95   Temp 98.1 F (36.7 C) (Oral)   Resp 18   Ht '5\' 1"'$  (1.549 m)   Wt 87.5 kg   SpO2 97%   BMI 36.47 kg/m   Visual Acuity Right Eye Distance:   Left Eye Distance:   Bilateral Distance:    Right Eye Near:   Left Eye Near:    Bilateral Near:     Physical Exam GEN:     alert, non-toxic elderly appearing female in no distress     HENT:  mucus membranes moist, oropharyngeal  without lesions or  exudate, 2+ tonsillar hypertrophy, mild oropharyngeal erythema, no nasal discharge,  bilateral TM  normal EYES:   pupils equal and reactive, EOMi ,  no scleral injection NECK:  normal ROM, bilateral tender anterior lymphadenopathy, no meningismus   RESP:  no increased work of breathing, clear to auscultation bilaterally CVS:   regular rate and rhythm Skin:   warm and dry, no rash on  visible skin, normal skin turgor    UC Treatments / Results  Labs (all labs ordered are listed, but only abnormal results are displayed) Labs Reviewed  GROUP A STREP BY PCR  RESP PANEL BY RT-PCR (RSV, FLU A&B, COVID)  [id number]    EKG   Radiology No results found.  Procedures Procedures (including critical care time)  Medications Ordered in UC Medications  lidocaine (XYLOCAINE) 2 % viscous mouth solution 15 mL (15 mLs Oral Given 11/27/21 0856)    Initial Impression / Assessment and Plan / UC Course  I have reviewed the triage vital signs and the nursing notes.  Pertinent labs & imaging results that were available during my care of the patient were reviewed by me and considered in my medical decision making (see chart for details).       Pt is a 61 y.o. female who presents for 3 days of sore throat and cough.  [name] is  afebrile here without recent antipyretics. Satting well on room air. Overall pt is non-toxic appearing, well hydrated, without respiratory distress. Pulmonary exam is unremarkable. Strep PCT, RSV, COVID and influenza testing obtained.   Pt to quarantine until COVID test results or longer if positive.  I will call patient with test results, if positive. History consistent with viral respiratory illness. Discussed symptomatic treatment.  Explained lack of efficacy of antibiotics in viral disease.  Typical duration of symptoms discussed. Return and ED precautions given and patient voiced understanding.  Strep, RSV, influenza and COVID test were negative.  Discussed MDM, treatment plan and plan for follow-up with patient who agrees with plan.     Final Clinical Impressions(s) / UC Diagnoses   Final diagnoses:  Acute pharyngitis, unspecified etiology     Discharge Instructions      We will contact you if your COVID/influenza/RSV/strep test is positive.  Please quarantine while you wait for the results.  If your test is negative you may resume normal  activities.  If your test is positive please continue to quarantine for at least 5 days from your symptom onset or until you are without a fever for at least 24 hours after the medications.   You can take Tylenol and/or Ibuprofen as needed for fever reduction and pain relief.    For cough:  honey 1/2 to 1 teaspoon (you can dilute the honey in water or another fluid).  You can also use guaifenesin and dextromethorphan for cough. You can use a humidifier for chest congestion and cough.  If you don't have a humidifier, you can sit in the bathroom with the hot shower running.      For sore throat: try warm salt water gargles, cepacol lozenges, throat spray, warm tea or water with lemon/honey, popsicles or ice, or OTC cold relief medicine for throat discomfort.    For congestion: take a daily anti-histamine like Zyrtec, Claritin, and a oral decongestant, such as pseudoephedrine.  You can also use Flonase 1-2 sprays in each nostril daily.    It is important to stay hydrated: drink plenty of fluids (water, gatorade/powerade/pedialyte, juices, or teas) to keep your throat moisturized and help further relieve irritation/discomfort.    Return or go to the Emergency Department if symptoms worsen or do not improve in the next few days      ED Prescriptions     Medication Sig Dispense Auth. Provider   lidocaine (XYLOCAINE) 2 % solution Use as directed 15 mLs in the mouth or throat as needed for mouth pain. 100 mL Demaria Deeney, DO   HYDROcodone bit-homatropine (HYCODAN) 5-1.5 MG/5ML syrup Take 5 mLs by mouth every 6 (six) hours as needed for cough. 120 mL Darbi Chandran, Ronnette Juniper, DO      I have reviewed the PDMP during this encounter.   Lyndee Hensen, DO 11/27/21 [phone]

## 2021-11-27 NOTE — Discharge Instructions (Addendum)
We will contact you if your COVID/influenza/RSV/strep test is positive.  Please quarantine while you wait for the results.  If your test is negative you may resume normal activities.  If your test is positive please continue to quarantine for at least 5 days from your symptom onset or until you are without a fever for at least 24 hours after the medications.   You can take Tylenol and/or Ibuprofen as needed for fever reduction and pain relief.    For cough: honey 1/2 to 1 teaspoon (you can dilute the honey in water or another fluid).  You can also use guaifenesin and dextromethorphan for cough. You can use a humidifier for chest congestion and cough.  If you don't have a humidifier, you can sit in the bathroom with the hot shower running.      For sore throat: try warm salt water gargles, cepacol lozenges, throat spray, warm tea or water with lemon/honey, popsicles or ice, or OTC cold relief medicine for throat discomfort.    For congestion: take a daily anti-histamine like Zyrtec, Claritin, and a oral decongestant, such as pseudoephedrine.  You can also use Flonase 1-2 sprays in each nostril daily.    It is important to stay hydrated: drink plenty of fluids (water, gatorade/powerade/pedialyte, juices, or teas) to keep your throat moisturized and help further relieve irritation/discomfort.    Return or go to the Emergency Department if symptoms worsen or do not improve in the next few days

## 2021-11-27 NOTE — ED Triage Notes (Signed)
PT c/o sore throat and cough x3days.  Pt states that she recently had bronchitis and was given prednisone and a Z-pack.   Pt states that she finished her medication 3 weeks ago and is now having throat swelling, dry mouth, and cough.
